# Patient Record
Sex: Female | Born: 2000 | Hispanic: Yes | Marital: Single | Smoking: Never smoker
Health system: Southern US, Community
[De-identification: ages and names within clinical notes are randomized; demographics above are authoritative.]

## PROBLEM LIST (undated history)

## (undated) DIAGNOSIS — Z227 Latent tuberculosis: Secondary | ICD-10-CM

## (undated) HISTORY — DX: Latent tuberculosis: Z22.7

---
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
## 2020-01-12 ENCOUNTER — Emergency Department (HOSPITAL_COMMUNITY)
Admission: EM | Admit: 2020-01-12 | Discharge: 2020-01-12 | Disposition: A | Discharge status: Home / Self Care | Payer: Self-pay | Attending: Emergency Medicine | Admitting: Emergency Medicine

## 2020-01-12 ENCOUNTER — Encounter (HOSPITAL_COMMUNITY): Payer: Self-pay | Admitting: Emergency Medicine

## 2020-01-12 ENCOUNTER — Other Ambulatory Visit: Payer: Self-pay

## 2020-01-12 ENCOUNTER — Emergency Department (HOSPITAL_COMMUNITY): Payer: Self-pay

## 2020-01-12 DIAGNOSIS — K759 Inflammatory liver disease, unspecified: Secondary | ICD-10-CM | POA: Insufficient documentation

## 2020-01-12 DIAGNOSIS — R1011 Right upper quadrant pain: Secondary | ICD-10-CM | POA: Insufficient documentation

## 2020-01-12 DIAGNOSIS — R638 Other symptoms and signs concerning food and fluid intake: Secondary | ICD-10-CM | POA: Insufficient documentation

## 2020-01-12 DIAGNOSIS — R5383 Other fatigue: Secondary | ICD-10-CM | POA: Insufficient documentation

## 2020-01-12 LAB — URINALYSIS, ROUTINE W REFLEX MICROSCOPIC
Bilirubin Urine: NEGATIVE
Glucose, UA: NEGATIVE mg/dL
Hgb urine dipstick: NEGATIVE
Ketones, ur: 20 mg/dL — AB
Leukocytes,Ua: NEGATIVE
Nitrite: NEGATIVE
Protein, ur: NEGATIVE mg/dL
Specific Gravity, Urine: 1.024 (ref 1.005–1.030)
pH: 7 (ref 5.0–8.0)

## 2020-01-12 LAB — CBC WITH DIFFERENTIAL/PLATELET
Abs Immature Granulocytes: 0 10*3/uL (ref 0.00–0.07)
Basophils Absolute: 0.1 10*3/uL (ref 0.0–0.1)
Basophils Relative: 2 %
Eosinophils Absolute: 0 10*3/uL (ref 0.0–0.5)
Eosinophils Relative: 0 %
HCT: 35.1 % — ABNORMAL LOW (ref 36.0–46.0)
Hemoglobin: 11.2 g/dL — ABNORMAL LOW (ref 12.0–15.0)
Lymphocytes Relative: 39 %
Lymphs Abs: 1.4 10*3/uL (ref 0.7–4.0)
MCH: 29 pg (ref 26.0–34.0)
MCHC: 31.9 g/dL (ref 30.0–36.0)
MCV: 90.9 fL (ref 80.0–100.0)
Monocytes Absolute: 0.1 10*3/uL (ref 0.1–1.0)
Monocytes Relative: 4 %
Neutro Abs: 2 10*3/uL (ref 1.7–7.7)
Neutrophils Relative %: 55 %
Platelets: 217 10*3/uL (ref 150–400)
RBC: 3.86 MIL/uL — ABNORMAL LOW (ref 3.87–5.11)
RDW: 13.2 % (ref 11.5–15.5)
WBC: 3.6 10*3/uL — ABNORMAL LOW (ref 4.0–10.5)
nRBC: 0 % (ref 0.0–0.2)
nRBC: 0 /100 WBC

## 2020-01-12 LAB — COMPREHENSIVE METABOLIC PANEL
ALT: 271 U/L — ABNORMAL HIGH (ref 0–44)
AST: 301 U/L — ABNORMAL HIGH (ref 15–41)
Albumin: 3.7 g/dL (ref 3.5–5.0)
Alkaline Phosphatase: 198 U/L — ABNORMAL HIGH (ref 38–126)
Anion gap: 8 (ref 5–15)
BUN: 8 mg/dL (ref 6–20)
CO2: 25 mmol/L (ref 22–32)
Calcium: 9 mg/dL (ref 8.9–10.3)
Chloride: 104 mmol/L (ref 98–111)
Creatinine, Ser: 0.56 mg/dL (ref 0.44–1.00)
GFR calc Af Amer: >60 mL/min (ref >60)
GFR calc non Af Amer: >60 mL/min (ref >60)
Glucose, Bld: 119 mg/dL — ABNORMAL HIGH (ref 70–99)
Potassium: 3.9 mmol/L (ref 3.5–5.1)
Sodium: 137 mmol/L (ref 135–145)
Total Bilirubin: 0.8 mg/dL (ref 0.3–1.2)
Total Protein: 7.5 g/dL (ref 6.5–8.1)

## 2020-01-12 LAB — LIPASE, BLOOD: Lipase: 46 U/L (ref 11–51)

## 2020-01-12 LAB — CBC
HCT: 37.2 % (ref 36.0–46.0)
Hemoglobin: 12 g/dL (ref 12.0–15.0)
MCH: 29.3 pg (ref 26.0–34.0)
MCHC: 32.3 g/dL (ref 30.0–36.0)
MCV: 90.7 fL (ref 80.0–100.0)
Platelets: 230 10*3/uL (ref 150–400)
RBC: 4.1 MIL/uL (ref 3.87–5.11)
RDW: 12.9 % (ref 11.5–15.5)
WBC: 3.6 10*3/uL — ABNORMAL LOW (ref 4.0–10.5)
nRBC: 0 % (ref 0.0–0.2)

## 2020-01-12 LAB — I-STAT BETA HCG BLOOD, ED (MC, WL, AP ONLY): I-stat hCG, quantitative: <5 m[IU]/mL (ref <5)

## 2020-01-12 MED ORDER — LACTATED RINGERS IV BOLUS
1000.0000 mL | Freq: Once | INTRAVENOUS | Status: AC
  Administered 2020-01-12: 1000 mL via INTRAVENOUS

## 2020-01-12 MED ORDER — SODIUM CHLORIDE 0.9% FLUSH: 3.0000 mL | Freq: Once | INTRAVENOUS | Status: DC

## 2020-01-12 MED ORDER — ONDANSETRON HCL 4 MG/2ML IJ SOLN
4.0000 mg | Freq: Once | INTRAMUSCULAR | Status: AC
  Administered 2020-01-12: 4 mg via INTRAVENOUS
  Filled 2020-01-12: qty 2

## 2020-01-12 MED ORDER — ONDANSETRON 4 MG PO TBDP: 4.0000 mg | ORAL_TABLET | Freq: Three times a day (TID) | ORAL | 0 refills | Status: AC | PRN

## 2020-01-12 NOTE — ED Provider Notes (Signed)
[facility] EMERGENCY DEPARTMENT Provider Note   CSN: [id number] Arrival date & time: 01/12/20  0215     History Chief Complaint  Patient presents with  . Abdominal Pain    Meredith Powers is a 19 y.o. female.  HPI Patient presents with nausea and vomiting for close to a week.  Mild right upper abdominal pain.  No diarrhea.  No fevers.  States she feels a little weak overall.  No hematemesis.  Has had decreased appetite without much oral intake.  Pain is dull.  Not necessarily worse with eating.  Has not been taking Tylenol.  States no medicines will stay down.  Does not drink alcohol.  No recent travel.  No blood transfusions.  No IV drug abuse.  No blood transfusions.    Past Medical History:  Diagnosis Date  . TB lung, latent    treated with 9 months INH in 2015    There are no problems to display for this patient.   History reviewed. No pertinent surgical history.   OB History   No obstetric history on file.     No family history on file.  Social History   Tobacco Use  . Smoking status: Never Smoker  . Smokeless tobacco: Never Used  Substance Use Topics  . Alcohol use: Never    Alcohol/week: 0.0 standard drinks  . Drug use: Never    Home Medications Prior to Admission medications   Medication Sig Start Date End Date Taking? Authorizing Provider  ondansetron (ZOFRAN-ODT) 4 MG disintegrating tablet Take 1 tablet (4 mg total) by mouth every 8 (eight) hours as needed for nausea or vomiting. 01/12/20   Benjiman Core, MD    Allergies    Patient has no known allergies.  Review of Systems   Review of Systems  Constitutional: Positive for appetite change and fatigue. Negative for fever.  HENT: Negative for congestion.   Respiratory: Negative for shortness of breath.   Gastrointestinal: Positive for abdominal pain, nausea and vomiting. Negative for diarrhea.  Genitourinary: Negative for flank pain.  Musculoskeletal: Negative for back  pain.  Skin: Negative for rash.  Neurological: Negative for weakness.  Psychiatric/Behavioral: Negative for confusion.    Physical Exam Updated Vital Signs BP (!) 105/56 (BP Location: Right Arm)   Pulse 57   Temp 98.1 F (36.7 C) (Oral)   Resp 16   Ht 4\' 9"  (1.448 m)   Wt 50 kg   LMP 12/23/2019   SpO2 100%   BMI 23.85 kg/m   Physical Exam Vitals and nursing note reviewed.  HENT:     Head: Normocephalic.  Cardiovascular:     Rate and Rhythm: Regular rhythm.  Pulmonary:     Breath sounds: No wheezing or rhonchi.  Abdominal:     Hernia: No hernia is present.     Comments: Mild right upper quadrant tenderness without rebound or guarding.  Skin:    General: Skin is warm.     Capillary Refill: Capillary refill takes less than 2 seconds.     Coloration: Skin is not jaundiced.  Neurological:     Mental Status: She is alert and oriented to person, place, and time.     ED Results / Procedures / Treatments   Labs (all labs ordered are listed, but only abnormal results are displayed) Labs Reviewed  COMPREHENSIVE METABOLIC PANEL - Abnormal; Notable for the following components:      Result Value   Glucose, Bld 119 (*)    AST  301 (*)    ALT 271 (*)    Alkaline Phosphatase 198 (*)    All other components within normal limits  CBC - Abnormal; Notable for the following components:   WBC 3.6 (*)    All other components within normal limits  URINALYSIS, ROUTINE W REFLEX MICROSCOPIC - Abnormal; Notable for the following components:   Color, Urine AMBER (*)    APPearance HAZY (*)    Ketones, ur 20 (*)    All other components within normal limits  CBC WITH DIFFERENTIAL/PLATELET - Abnormal; Notable for the following components:   WBC 3.6 (*)    RBC 3.86 (*)    Hemoglobin 11.2 (*)    HCT 35.1 (*)    All other components within normal limits  LIPASE, BLOOD  HEPATITIS PANEL, ACUTE  I-STAT BETA HCG BLOOD, ED ([facility], [facility], [facility] ONLY)  I-STAT BETA HCG BLOOD, ED ([facility], [facility], [facility] ONLY)     EKG None  Radiology US Abdomen Limited  Result Date: 01/12/2020 CLINICAL DATA:  Right upper quadrant abdominal pain today. Elevated liver function studies. EXAM: ULTRASOUND ABDOMEN LIMITED RIGHT UPPER QUADRANT COMPARISON:  None. FINDINGS: Gallbladder: 3 mm gallbladder polyp, unlikely to be clinically significant. This requires no specific follow-up. No evidence of gallstones or gallbladder wall thickening. No sonographic Murphy sign. Common bile duct: Diameter: 3 mm Liver: No focal lesion identified. Within normal limits in parenchymal echogenicity. Portal vein is patent on color Doppler imaging with normal direction of blood flow towards the liver. Other: None. IMPRESSION: No acute or significant right upper quadrant abdominal findings. No evidence of cholecystitis or biliary dilatation. Electronically Signed   By: Carey Bullocks M.D.   On: 01/12/2020 13:58    Procedures Procedures (including critical care time)  Medications Ordered in ED Medications  sodium chloride flush (NS) 0.9 % injection 3 mL (has no administration in time range)  lactated ringers bolus 1,000 mL (0 mLs Intravenous Stopped 01/12/20 1508)  ondansetron (ZOFRAN) injection 4 mg (4 mg Intravenous Given 01/12/20 0948)    ED Course  I have reviewed the triage vital signs and the nursing notes.  Pertinent labs & imaging results that were available during my care of the patient were reviewed by me and considered in my medical decision making (see chart for details).    MDM Rules/Calculators/A&P                          Patient with nausea and vomiting.  LFTs elevated.  Mild hypotension.  Feels better after IV fluids.  Blood pressure somewhat improved.  Has tolerated orals.  Ultrasound done and reassuring without cause for elevated liver function.  Patient has a hepatitis of an unknown origin.  Added hepatitis panel.  Discussed with Dr. Marina Goodell from GI.  Outpatient follow-up.  Could follow-up PCP or GI if needed.  Will  discharge home. Final Clinical Impression(s) / ED Diagnoses Final diagnoses:  Hepatitis    Rx / DC Orders ED Discharge Orders         Ordered    ondansetron (ZOFRAN-ODT) 4 MG disintegrating tablet  Every 8 hours PRN     Discontinue  Reprint     01/12/20 1528           Benjiman Core, MD 01/12/20 1549

## 2020-01-12 NOTE — ED Triage Notes (Signed)
Patient reports upper abdominal pain and emesis after eating a meal onset this week , denies diarrhea , no fever or chills .

## 2020-01-12 NOTE — Discharge Instructions (Signed)
Your liver looks irritated.  Follow-up with either your primary doctor or with gastroenterology.  Return to the ER if you develop blood in the emesis, if you have change color to yellow.  If you cannot keep fluids down.

## 2020-01-12 NOTE — ED Notes (Signed)
Patient Alert and oriented to baseline. Stable and ambulatory to baseline. Patient verbalized understanding of the discharge instructions.  Patient belongings were taken by the patient.   

## 2020-01-13 LAB — HEPATITIS PANEL, ACUTE
HCV Ab: NONREACTIVE
Hep A IgM: NONREACTIVE
Hep B C IgM: NONREACTIVE
Hepatitis B Surface Ag: NONREACTIVE

## 2020-04-21 ENCOUNTER — Other Ambulatory Visit: Payer: Self-pay

## 2020-04-21 DIAGNOSIS — Z20822 Contact with and (suspected) exposure to covid-19: Secondary | ICD-10-CM

## 2020-04-22 LAB — NOVEL CORONAVIRUS, NAA: SARS-CoV-2, NAA: NOT DETECTED

## 2020-04-22 LAB — SARS-COV-2, NAA 2 DAY TAT

## 2021-12-16 IMAGING — US US ABDOMEN LIMITED
1 series · 14 of 25 positions shown · non-contrast
Comparison: None.

CLINICAL DATA: Right upper quadrant abdominal pain today. Elevated
liver function studies.

EXAM:
ULTRASOUND ABDOMEN LIMITED RIGHT UPPER QUADRANT

[Series 1: us abdomen limited · 14 of 44 slices shown]
[im 1/44]
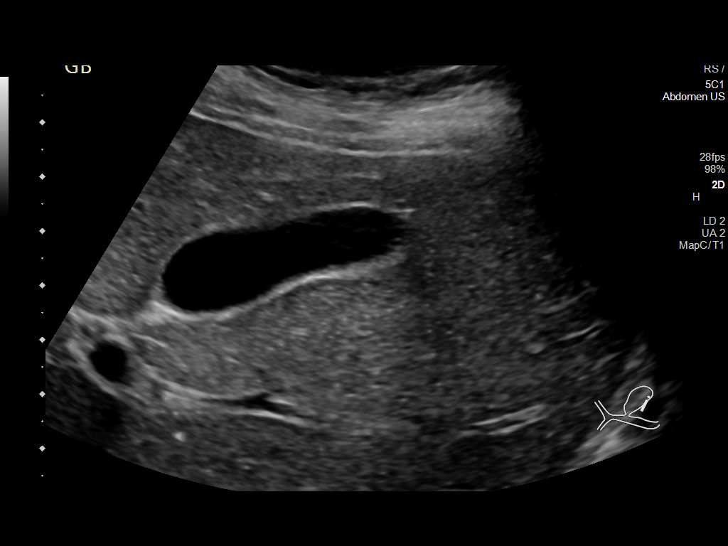
[im 4/44]
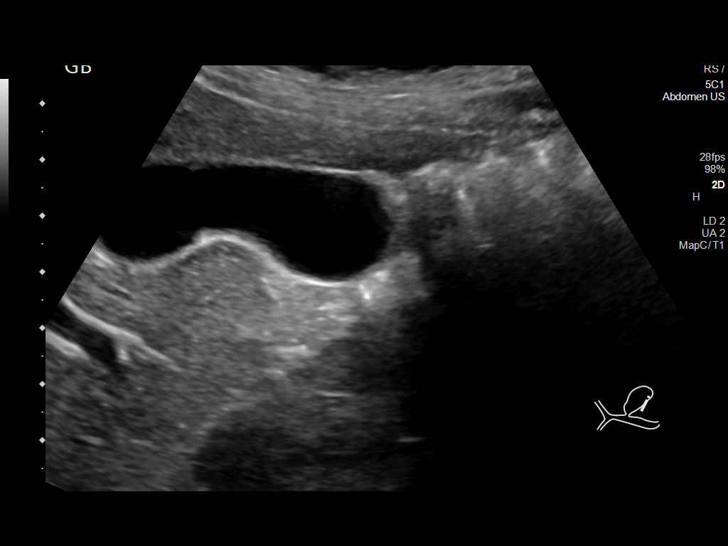
[im 8/44]
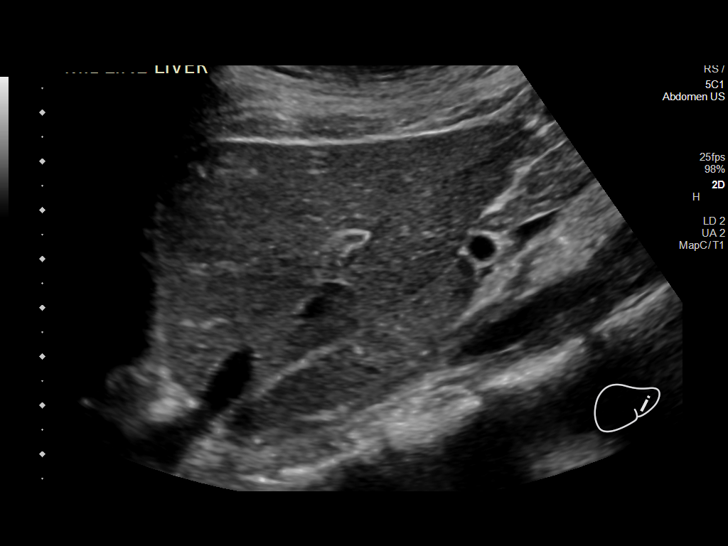
[im 11/44]
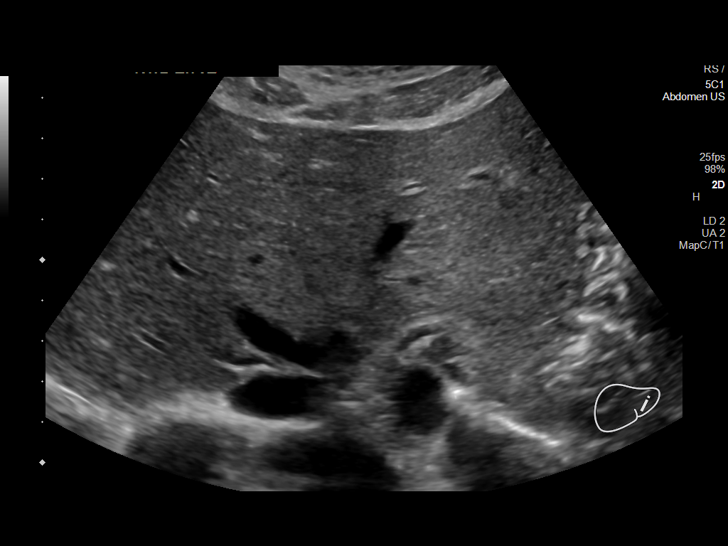
[im 15/44]
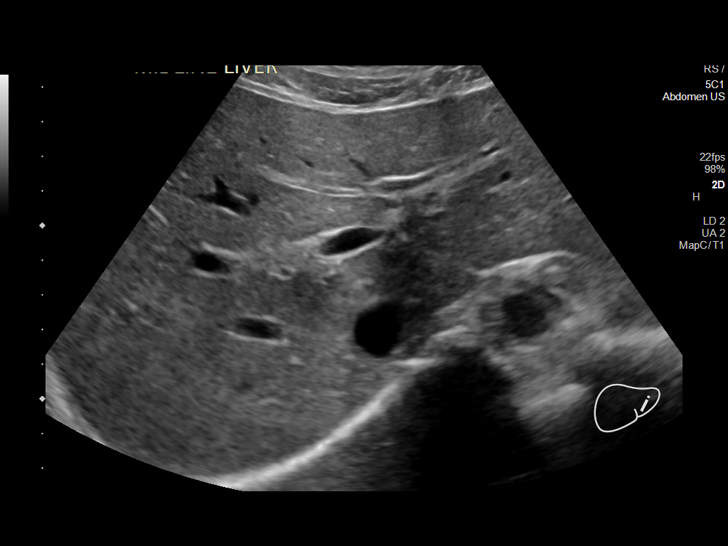
[im 17/44]
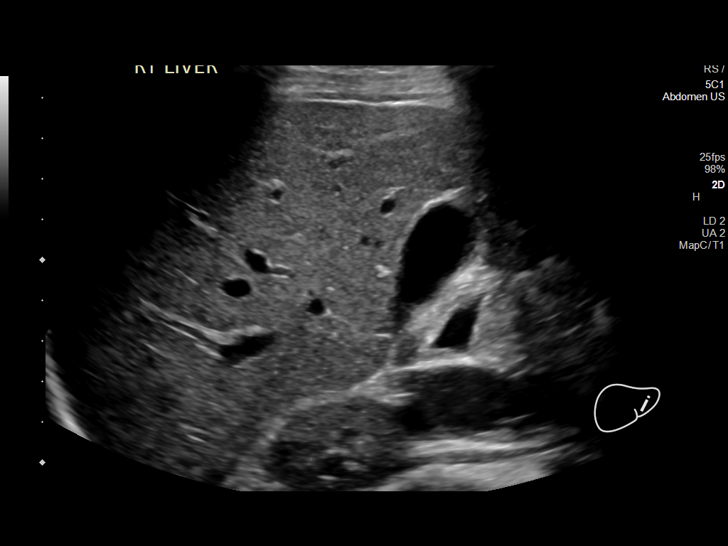
[im 20/44]
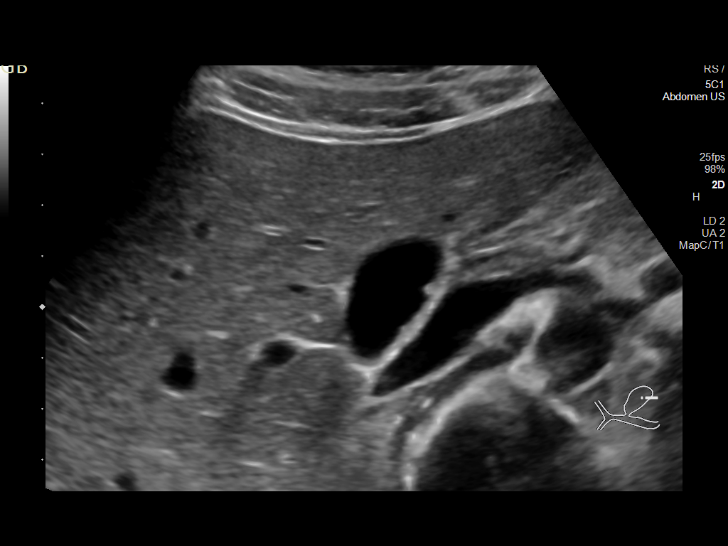
[im 24/44]
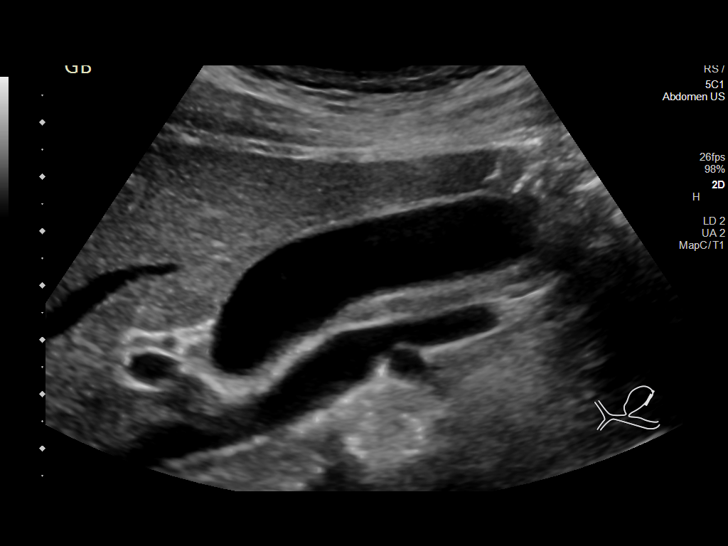
[im 27/44]
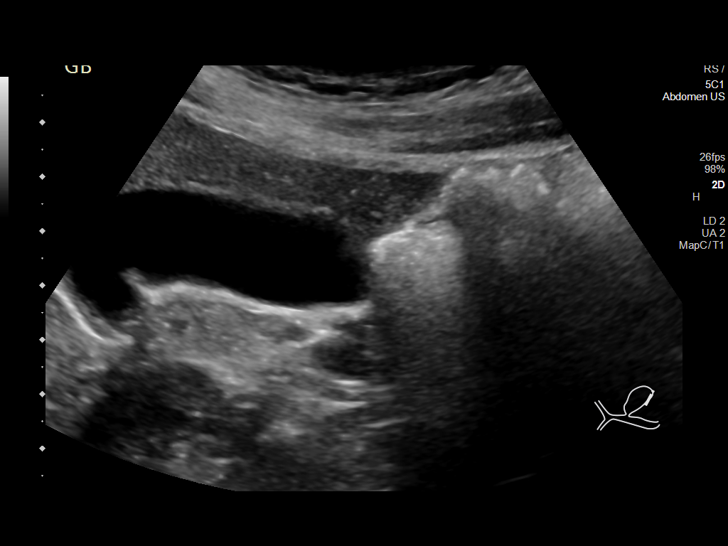
[im 29/44]
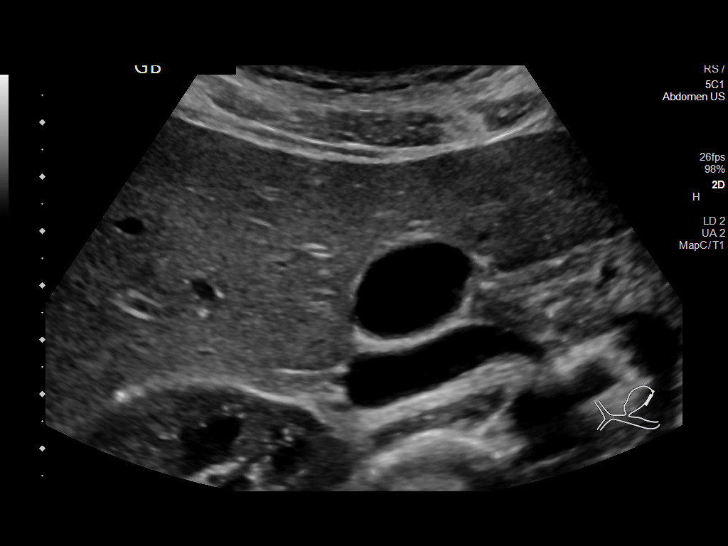
[im 33/44]
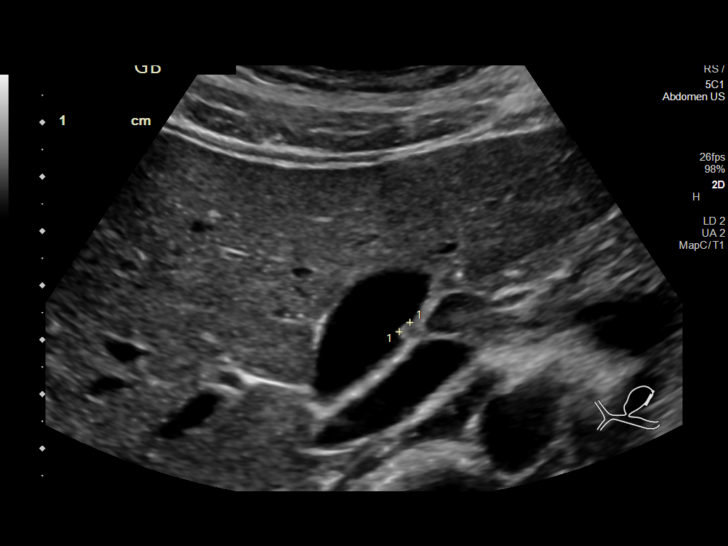
[im 36/44]
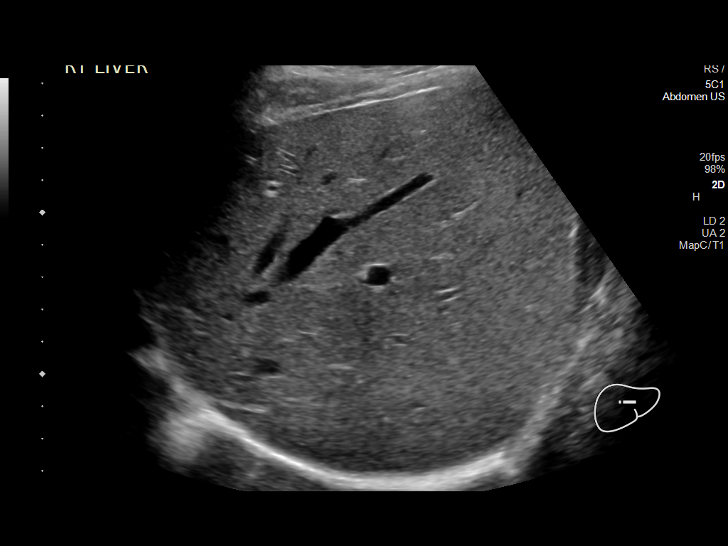
[im 40/44]
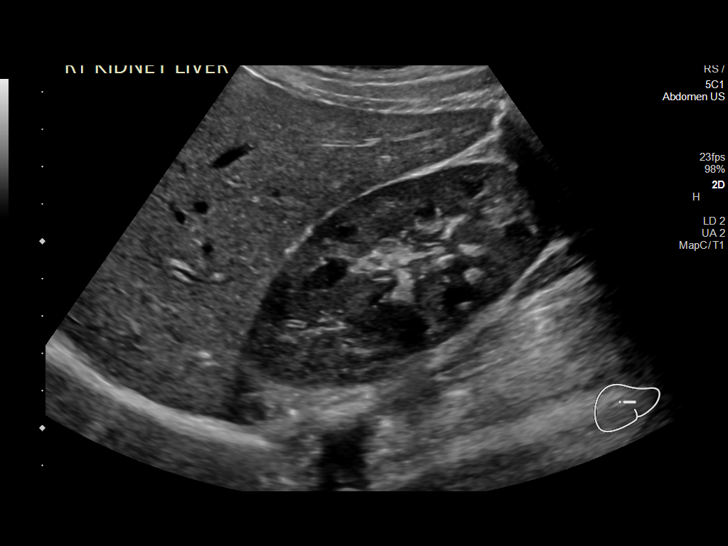
[im 44/44]
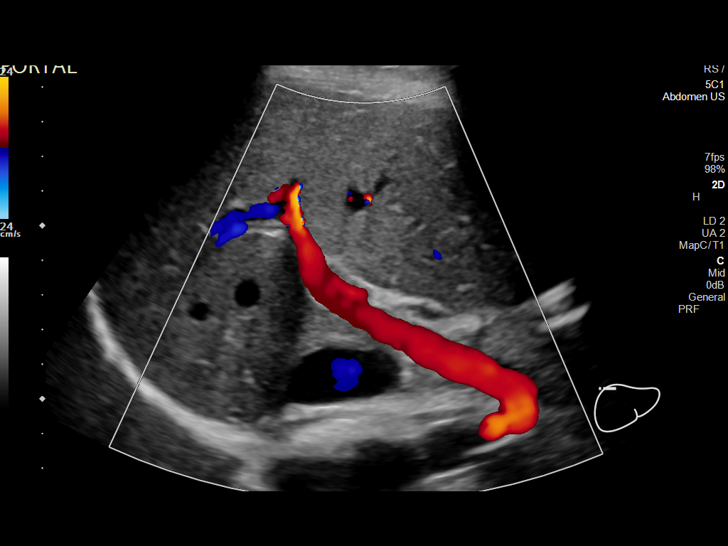

[14 of 25 positions shown; findings below may reference images not displayed]

FINDINGS: Gallbladder:

3 mm gallbladder polyp, unlikely to be clinically significant. This
requires no specific follow-up. No evidence of gallstones or
gallbladder wall thickening. No sonographic Murphy sign.

Common bile duct:

Diameter: 3 mm

Liver:

No focal lesion identified. Within normal limits in parenchymal
echogenicity. Portal vein is patent on color Doppler imaging with
normal direction of blood flow towards the liver.

Other: None.
IMPRESSION: No acute or significant right upper quadrant abdominal findings. No
evidence of cholecystitis or biliary dilatation.

## 2024-06-28 ENCOUNTER — Ambulatory Visit
Admission: EM | Admit: 2024-06-28 | Discharge: 2024-06-28 | Disposition: A | Discharge status: Home / Self Care | Payer: Self-pay | Attending: Family Medicine | Admitting: Family Medicine

## 2024-06-28 DIAGNOSIS — G8929 Other chronic pain: Secondary | ICD-10-CM

## 2024-06-28 DIAGNOSIS — M546 Pain in thoracic spine: Secondary | ICD-10-CM

## 2024-06-28 MED ORDER — IBUPROFEN 600 MG PO TABS: 600.0000 mg | ORAL_TABLET | Freq: Four times a day (QID) | ORAL | 0 refills | Status: AC | PRN

## 2024-06-28 MED ORDER — CYCLOBENZAPRINE HCL 5 MG PO TABS: 5.0000 mg | ORAL_TABLET | Freq: Every evening | ORAL | 0 refills | Status: AC | PRN

## 2024-06-28 NOTE — ED Triage Notes (Signed)
 Pt presents for evaluation of (R) upper back pain x 6 months. Pt does heavy lifting in the gym and thinks she pulled something. Pt reports that is starts out as numbness and tingling then progresses to an ache. Pt is still lifting, but not as heavy. Pt denies any numbness or tingling in (R) hand/fingers. Denies any lower back pain. No oral medications for symptoms. Pt has tried heat with no relief. Denies swelling and neck pain.

## 2024-06-28 NOTE — Discharge Instructions (Signed)
 I suspect you either strained or tore a muscle in your back, possibly your rotator cuff muscles. At this stage, it would be important to follow up with an orthopedist so that they can pursue an ultrasound or an MRI to rule out this kind of injury. For now, I would avoid exercise with weights to prevent further injury. Use ibuprofen  for pain and inflammation, cyclobenzaprine  as a muscle relaxant.

## 2024-06-28 NOTE — ED Provider Notes (Signed)
 " [facility]  Note:  This document was prepared using Conservation officer, historic buildings and may include unintentional dictation errors.  MRN: [id number] DOB: 01/26/2001  Subjective:   Meredith Powers is a 24 y.o. female presenting for 11-month history of persistent right thoracic back pain.  Has associated burning, numbness and tingling that also feels like a soreness and ache.  Patient has been doing a lot of weight training and believes that she hurt herself in the gym as that is when her symptoms started.  No neck pain, spinal tenderness, shoulder pain, shoulder weakness, rashes, bruising, cough, chest pain, shortness of breath or wheezing.  No current outpatient medications  Allergies[1]  History reviewed. No pertinent past medical history.   History reviewed. No pertinent surgical history.  History reviewed. No pertinent family history.  Social History   Occupational History   Not on file  Tobacco Use   Smoking status: Never   Smokeless tobacco: Never  Vaping Use   Vaping status: Some Days  Substance and Sexual Activity   Alcohol use: Yes    Comment: occasionally   Drug use: Never   Sexual activity: Never     ROS   Objective:   Vitals: BP 110/73 (BP Location: Left Arm)   Pulse 98   Temp 97.9 F (36.6 C) (Oral)   Resp 16   LMP 06/12/2024 (Exact Date)   SpO2 98%   Physical Exam Constitutional:      General: She is not in acute distress.    Appearance: Normal appearance. She is well-developed. She is not ill-appearing, toxic-appearing or diaphoretic.  HENT:     Head: Normocephalic and atraumatic.     Right Ear: External ear normal.     Left Ear: External ear normal.     Nose: Nose normal.     Mouth/Throat:     Mouth: Mucous membranes are moist.  Eyes:     General: No scleral icterus.       Right eye: No discharge.        Left eye: No discharge.     Extraocular Movements: Extraocular movements intact.  Cardiovascular:     Rate and  Rhythm: Normal rate.  Pulmonary:     Effort: Pulmonary effort is normal.  Musculoskeletal:     Right shoulder: No swelling, deformity, effusion, laceration, tenderness, bony tenderness or crepitus. Normal range of motion. Normal strength.       Arms:     Cervical back: Normal range of motion and neck supple. No swelling, edema, deformity, erythema, signs of trauma, lacerations, rigidity, spasms, torticollis, tenderness, bony tenderness or crepitus. No pain with movement, spinous process tenderness or muscular tenderness. Normal range of motion.     Thoracic back: Tenderness (over area outlined) present. No swelling, edema, deformity, signs of trauma, lacerations, spasms or bony tenderness. Normal range of motion. No scoliosis.  Skin:    General: Skin is warm and dry.  Neurological:     General: No focal deficit present.     Mental Status: She is alert and oriented to person, place, and time.     Cranial Nerves: No cranial nerve deficit.     Motor: No weakness.     Coordination: Coordination normal.     Gait: Gait normal.     Deep Tendon Reflexes: Reflexes normal.  Psychiatric:        Mood and Affect: Mood normal.        Behavior: Behavior normal.  Assessment and Plan :   PDMP not reviewed this encounter.  1. Chronic right-sided thoracic back pain      X-ray imaging would be low yield given lack of trauma.  Recommended follow-up with an orthopedist for consideration of ultrasound or MRI to rule out muscular tear, muscular injury.  In the meantime emphasized need to avoid weight training.  Use ibuprofen  for pain and inflammation, cyclobenzaprine  at bedtime.  Counseled patient on potential for adverse effects with medications prescribed/recommended today, ER and return-to-clinic precautions discussed, patient verbalized understanding.     [1] No Known Allergies    Christopher Savannah, PA-C 06/28/24 1623  "
